# Patient Record
Sex: Male | Born: 1980 | Race: White | Hispanic: No | Marital: Married | Smoking: Former smoker
Health system: Southern US, Community
[De-identification: ages and names within clinical notes are randomized; demographics above are authoritative.]

## PROBLEM LIST (undated history)

## (undated) DIAGNOSIS — R0602 Shortness of breath: Secondary | ICD-10-CM

## (undated) HISTORY — DX: Shortness of breath: R06.02

## (undated) HISTORY — PX: BACK SURGERY: SHX140

---

## 2019-10-24 ENCOUNTER — Ambulatory Visit: Payer: 59 | Admitting: Pulmonary Disease

## 2019-10-24 ENCOUNTER — Encounter: Payer: Self-pay | Admitting: Pulmonary Disease

## 2019-10-24 ENCOUNTER — Ambulatory Visit (INDEPENDENT_AMBULATORY_CARE_PROVIDER_SITE_OTHER): Payer: 59

## 2019-10-24 ENCOUNTER — Other Ambulatory Visit: Payer: Self-pay

## 2019-10-24 VITALS — BP 118/74 | HR 88 | Temp 97.5°F | Ht 74.0 in | Wt 263.2 lb

## 2019-10-24 DIAGNOSIS — R0602 Shortness of breath: Secondary | ICD-10-CM

## 2019-10-24 NOTE — Patient Instructions (Signed)
We will get a chest x-ray today to evaluate the lungs Continue to exercise and get back into shape Follow-up in 1 to 2 months

## 2019-10-24 NOTE — Progress Notes (Signed)
Mark Mcgrath    [id number]    [birth year]  Primary Care Physician:Hungarland, Carlena Bjornstad, MD  Referring Physician: No referring provider defined for this encounter.  Chief complaint: Consult for post COVID-19 pneumonia  HPI: 39 year old with no significant past medical history.  Diagnosed with COVID-19 around December 11.  He was hospitalized for 5 days at [facility] at the end of December.  Treated with remdesivir, steroids and discharged on supplemental oxygen.  States that post discharge his breathing has improved and he has been himself off oxygen.  He still has mild dyspnea on exertion.  Started on albuterol by his primary care Chief complaint is insomnia.  He is unable to get a full nights of sleep.  Wakes up in the middle of the night and is unable to go back to sleep.  He has been advised to take Advil PM by his primary care which helps somewhat  Pets: Has a dog.  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  No mold, hot tub, Jacuzzi, no down pillows or comforters Smoking history: 5-pack-year smoker.  Quit in 2013.  He vapes nicotine from 2013 11/2016 Travel history: No significant travel history Relevant family history: No significant family history of lung disease  Outpatient Encounter Medications as of 10/24/2019  Medication Sig  . albuterol (VENTOLIN HFA) 108 (90 Base) MCG/ACT inhaler Inhale 2 puffs into the lungs every 4 (four) hours as needed.    No facility-administered encounter medications on file as of 10/24/2019.    Allergies as of 10/24/2019  . (No Known Allergies)    Past Medical History:  Diagnosis Date  . Shortness of breath     Past Surgical History:  Procedure Laterality Date  . BACK SURGERY     L4-L5    Family History  Family history unknown: Yes    Social History   Socioeconomic History  . Marital status: Married    Spouse name: Not on file  . Number of  children: Not on file  . Years of education: Not on file  . Highest education level: Not on file  Occupational History  . Not on file  Tobacco Use  . Smoking status: Former Smoker    Packs/day: 1.00    Years: 10.00    Pack years: 10.00    Types: Cigarettes    Quit date: 10/23/2009    Years since quitting: 10.0  . Smokeless tobacco: Never Used  Substance and Sexual Activity  . Alcohol use: Not Currently  . Drug use: Never  . Sexual activity: Not on file  Other Topics Concern  . Not on file  Social History Narrative  . Not on file   Social Determinants of Health   Financial Resource Strain:   . Difficulty of Paying Living Expenses: Not on file  Food Insecurity:   . Worried About Programme researcher, broadcasting/film/video in the Last Year: Not on file  . Ran Out of Food in the Last Year: Not on file  Transportation Needs:   . Lack of Transportation (Medical): Not on file  . Lack of Transportation (Non-Medical): Not on file  Physical Activity:   . Days of Exercise per Week: Not on file  . Minutes of Exercise per Session: Not on file  Stress:   . Feeling of Stress : Not on file  Social Connections:   . Frequency of Communication with Friends and Family:  Not on file  . Frequency of Social Gatherings with Friends and Family: Not on file  . Attends Religious Services: Not on file  . Active Member of Clubs or Organizations: Not on file  . Attends Archivist Meetings: Not on file  . Marital Status: Not on file  Intimate Partner Violence:   . Fear of Current or Ex-Partner: Not on file  . Emotionally Abused: Not on file  . Physically Abused: Not on file  . Sexually Abused: Not on file    Review of systems: Review of Systems  Constitutional: Negative for fever and chills.  HENT: Negative.   Eyes: Negative for blurred vision.  Respiratory: as per HPI  Cardiovascular: Negative for chest pain and palpitations.  Gastrointestinal: Negative for vomiting, diarrhea, blood per  rectum. Genitourinary: Negative for dysuria, urgency, frequency and hematuria.  Musculoskeletal: Negative for myalgias, back pain and joint pain.  Skin: Negative for itching and rash.  Neurological: Negative for dizziness, tremors, focal weakness, seizures and loss of consciousness.  Endo/Heme/Allergies: Negative for environmental allergies.  Psychiatric/Behavioral: Negative for depression, suicidal ideas and hallucinations.  All other systems reviewed and are negative.  Physical Exam: Blood pressure 118/74, pulse 88, temperature (!) 97.5 F (36.4 C), temperature source Temporal, height 6\' 2"  (1.88 m), weight 263 lb 3.2 oz (119.4 kg). Gen:      No acute distress HEENT:  EOMI, sclera anicteric Neck:     No masses; no thyromegaly Lungs:    Clear to auscultation bilaterally; normal respiratory effort CV:         Regular rate and rhythm; no murmurs Abd:      + bowel sounds; soft, non-tender; no palpable masses, no distension Ext:    No edema; adequate peripheral perfusion Skin:      Warm and dry; no rash Neuro: alert and oriented x 3 Psych: normal mood and affect  Data Reviewed:  Assessment:  Post COVID-19 pneumonia Continues to make slow improvements. Currently off supplemental oxygen with no desats noted We will get chest x-ray to evaluate his lungs.  If abnormal then get CT chest  Plan/Recommendations: -Chest x-ray  This appointment required 30 minutes of patient care (this includes precharting, chart review, review of results, face-to-face care, etc.).  Marshell Garfinkel MD [facility] 10/24/2019, 12:37 PM  CC: No ref. provider found

## 2019-11-03 ENCOUNTER — Telehealth: Payer: Self-pay | Admitting: Pulmonary Disease

## 2019-11-03 NOTE — Telephone Encounter (Signed)
Mark Greathouse, MD  11/01/2019 12:02 PM EST    Let patient know the chest x-ray looks okay with no evidence of post Covid lung issues There is mild airway inflammation which may indicate asthma Order Symbicort 160 twice daily. Will reassess at return visit.   Attempted to call pt but unable to reach and unable to leave a VM due to mailbox being full. Will try to call back later.

## 2019-11-04 MED ORDER — BUDESONIDE-FORMOTEROL FUMARATE 160-4.5 MCG/ACT IN AERO: 2.0000 | INHALATION_SPRAY | Freq: Two times a day (BID) | RESPIRATORY_TRACT | 0 refills | Status: DC

## 2019-11-04 NOTE — Telephone Encounter (Signed)
I called and spoke with the pt and notified of results/recs and he verbalized understanding  Rx was sent to pharm

## 2019-11-20 ENCOUNTER — Telehealth: Payer: Self-pay | Admitting: Pulmonary Disease

## 2019-11-20 MED ORDER — BUDESONIDE-FORMOTEROL FUMARATE 160-4.5 MCG/ACT IN AERO: 2.0000 | INHALATION_SPRAY | Freq: Two times a day (BID) | RESPIRATORY_TRACT | 11 refills | Status: AC

## 2019-11-20 NOTE — Telephone Encounter (Signed)
Called and spoke with pt. Pt stated he wanted to change pharmacies for his symbicort inhaler as he stated each time he calls his previous pharmacy to see if they have his symbicort inhaler rx there, they keep telling him that they do not have it. I have sent pt's symbicort rx to different pharmacy of pt's choice. Nothing further needed.

## 2019-12-02 ENCOUNTER — Encounter: Payer: Self-pay | Admitting: Pulmonary Disease

## 2019-12-02 ENCOUNTER — Ambulatory Visit: Payer: 59 | Admitting: Pulmonary Disease

## 2019-12-02 ENCOUNTER — Other Ambulatory Visit: Payer: Self-pay

## 2019-12-02 ENCOUNTER — Telehealth: Payer: Self-pay | Admitting: Pulmonary Disease

## 2019-12-02 VITALS — BP 118/72 | HR 64 | Temp 97.4°F | Ht 74.0 in | Wt 258.2 lb

## 2019-12-02 DIAGNOSIS — R0602 Shortness of breath: Secondary | ICD-10-CM

## 2019-12-02 NOTE — Patient Instructions (Signed)
Glad you are doing well with regard to your breathing. You can stop the inhalers as they do not appear to make any difference His exercise program and get back into shape as you are doing Follow-up as needed

## 2019-12-02 NOTE — Progress Notes (Signed)
Mark Mcgrath    [id number]    [birth year]  Primary Care Physician:Hungarland, Carlena Bjornstad, MD  Referring Physician: Maximiano Coss, MD [facility] [address] [place],  [place] [address]  Chief complaint: Post COVID-19 pneumonia  HPI: 39 year old with no significant past medical history.  Diagnosed with COVID-19 around December 11.  He was hospitalized for 5 days at [facility] at the end of December.  Treated with remdesivir, steroids and discharged on supplemental oxygen.  States that post discharge his breathing has improved and he has been himself off oxygen.  He still has mild dyspnea on exertion.  Started on albuterol by his primary care Chief complaint is insomnia.  He is unable to get a full nights of sleep.  Wakes up in the middle of the night and is unable to go back to sleep.  He has been advised to take Advil PM by his primary care which helps somewhat  Pets: Has a dog.  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  No mold, hot tub, Jacuzzi, no down pillows or comforters Smoking history: 5-pack-year smoker.  Quit in 2013.  He vapes nicotine from 2013 11/2016 Travel history: No significant travel history Relevant family history: No significant family history of lung disease  Interim history: Continues to do well with improving respiratory status.  He feels back to baseline now He has started exercising with a Peloton and tennis without any issues His sleep is improved as well.  We had given him Symbicort which he has used for 2 weeks but cannot tell if it is making any difference  Outpatient Encounter Medications as of 12/02/2019  Medication Sig  . albuterol (VENTOLIN HFA) 108 (90 Base) MCG/ACT inhaler Inhale 2 puffs into the lungs every 4 (four) hours as needed.   . budesonide-formoterol (SYMBICORT) 160-4.5 MCG/ACT inhaler Inhale 2 puffs into the lungs 2  (two) times daily.   No facility-administered encounter medications on file as of 12/02/2019.   Physical Exam: Blood pressure 118/74, pulse 88, temperature (!) 97.5 F (36.4 C), temperature source Temporal, height 6\' 2"  (1.88 m), weight 263 lb 3.2 oz (119.4 kg). Gen:      No acute distress HEENT:  EOMI, sclera anicteric Neck:     No masses; no thyromegaly Lungs:    Clear to auscultation bilaterally; normal respiratory effort CV:         Regular rate and rhythm; no murmurs Abd:      + bowel sounds; soft, non-tender; no palpable masses, no distension Ext:    No edema; adequate peripheral perfusion Skin:      Warm and dry; no rash Neuro: alert and oriented x 3 Psych: normal mood and affect  Data Reviewed: Chest x-ray 10/24/2019-active cardiopulmonary disease, mild central airway thickening.  I have reviewed the images personally.  Assessment:  Post COVID-19 pneumonia Continues to improve and is back to baseline Chest x-ray with no significant lung abnormality.  There is suggestion of reactive airways disease but clinically does not appear to have asthma Symbicort has not made any difference and we will stop it  I told him to continue an exercise regimen and get back into shape.  As he is doing well he can follow-up with 10/26/2019 as needed  Plan/Recommendations: - Stop inhalers.  Follow-up as needed  This appointment required 30 minutes of patient care (this includes precharting, chart  review, review of results, face-to-face care, etc.).  Marshell Garfinkel MD [facility] 12/02/2019, 8:28 AM  CC: Hungarland, Jenetta Downer,*

## 2019-12-02 NOTE — Telephone Encounter (Signed)
Order placed to have pt's O2 d/c'd. Nothing further needed.

## 2021-05-04 IMAGING — DX DG CHEST 2V
2 series · 2 of 2 positions shown · non-contrast
Comparison: None.

CLINICAL DATA: Shortness of breath, post COVID

EXAM:
CHEST - 2 VIEW

[chest pa]
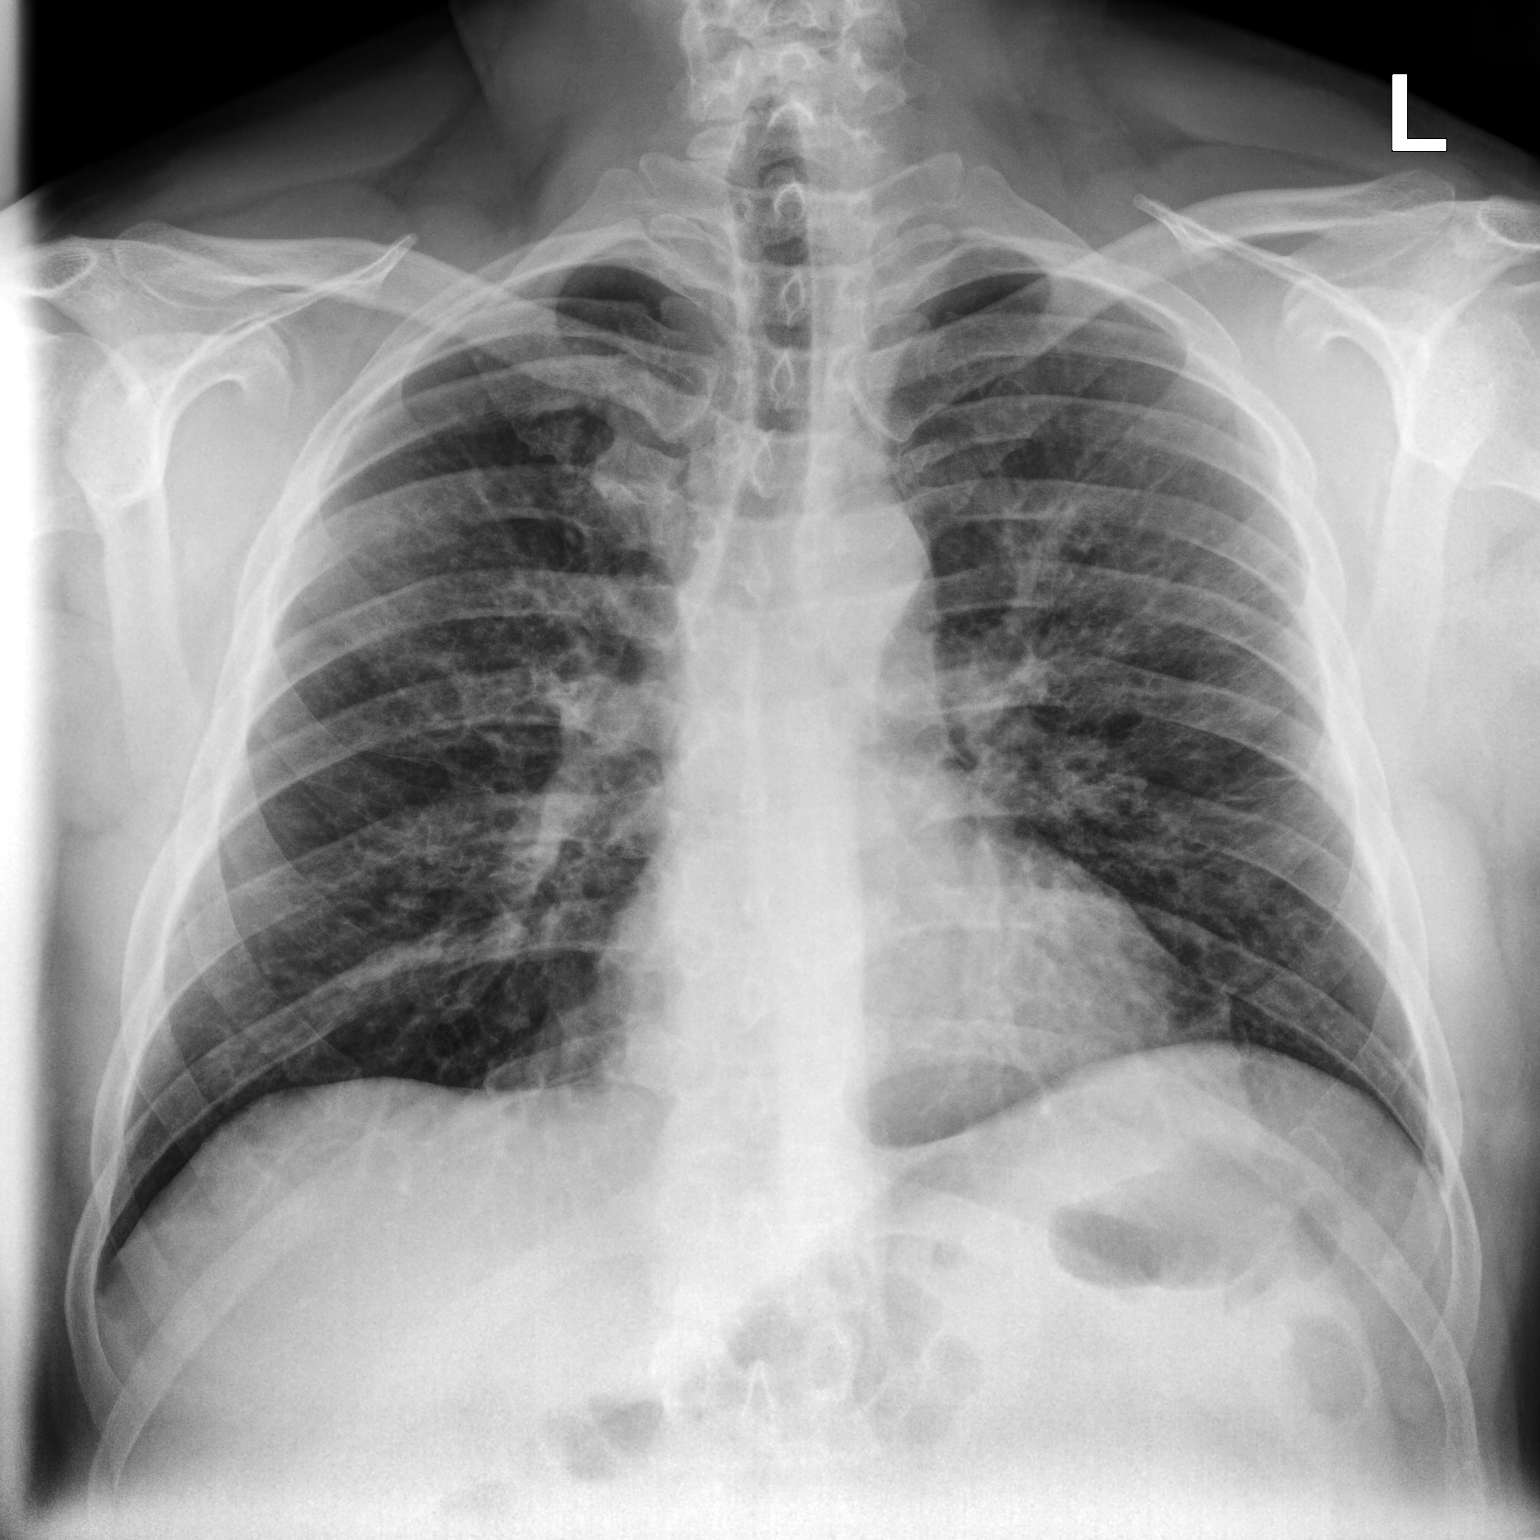

[chest lat]
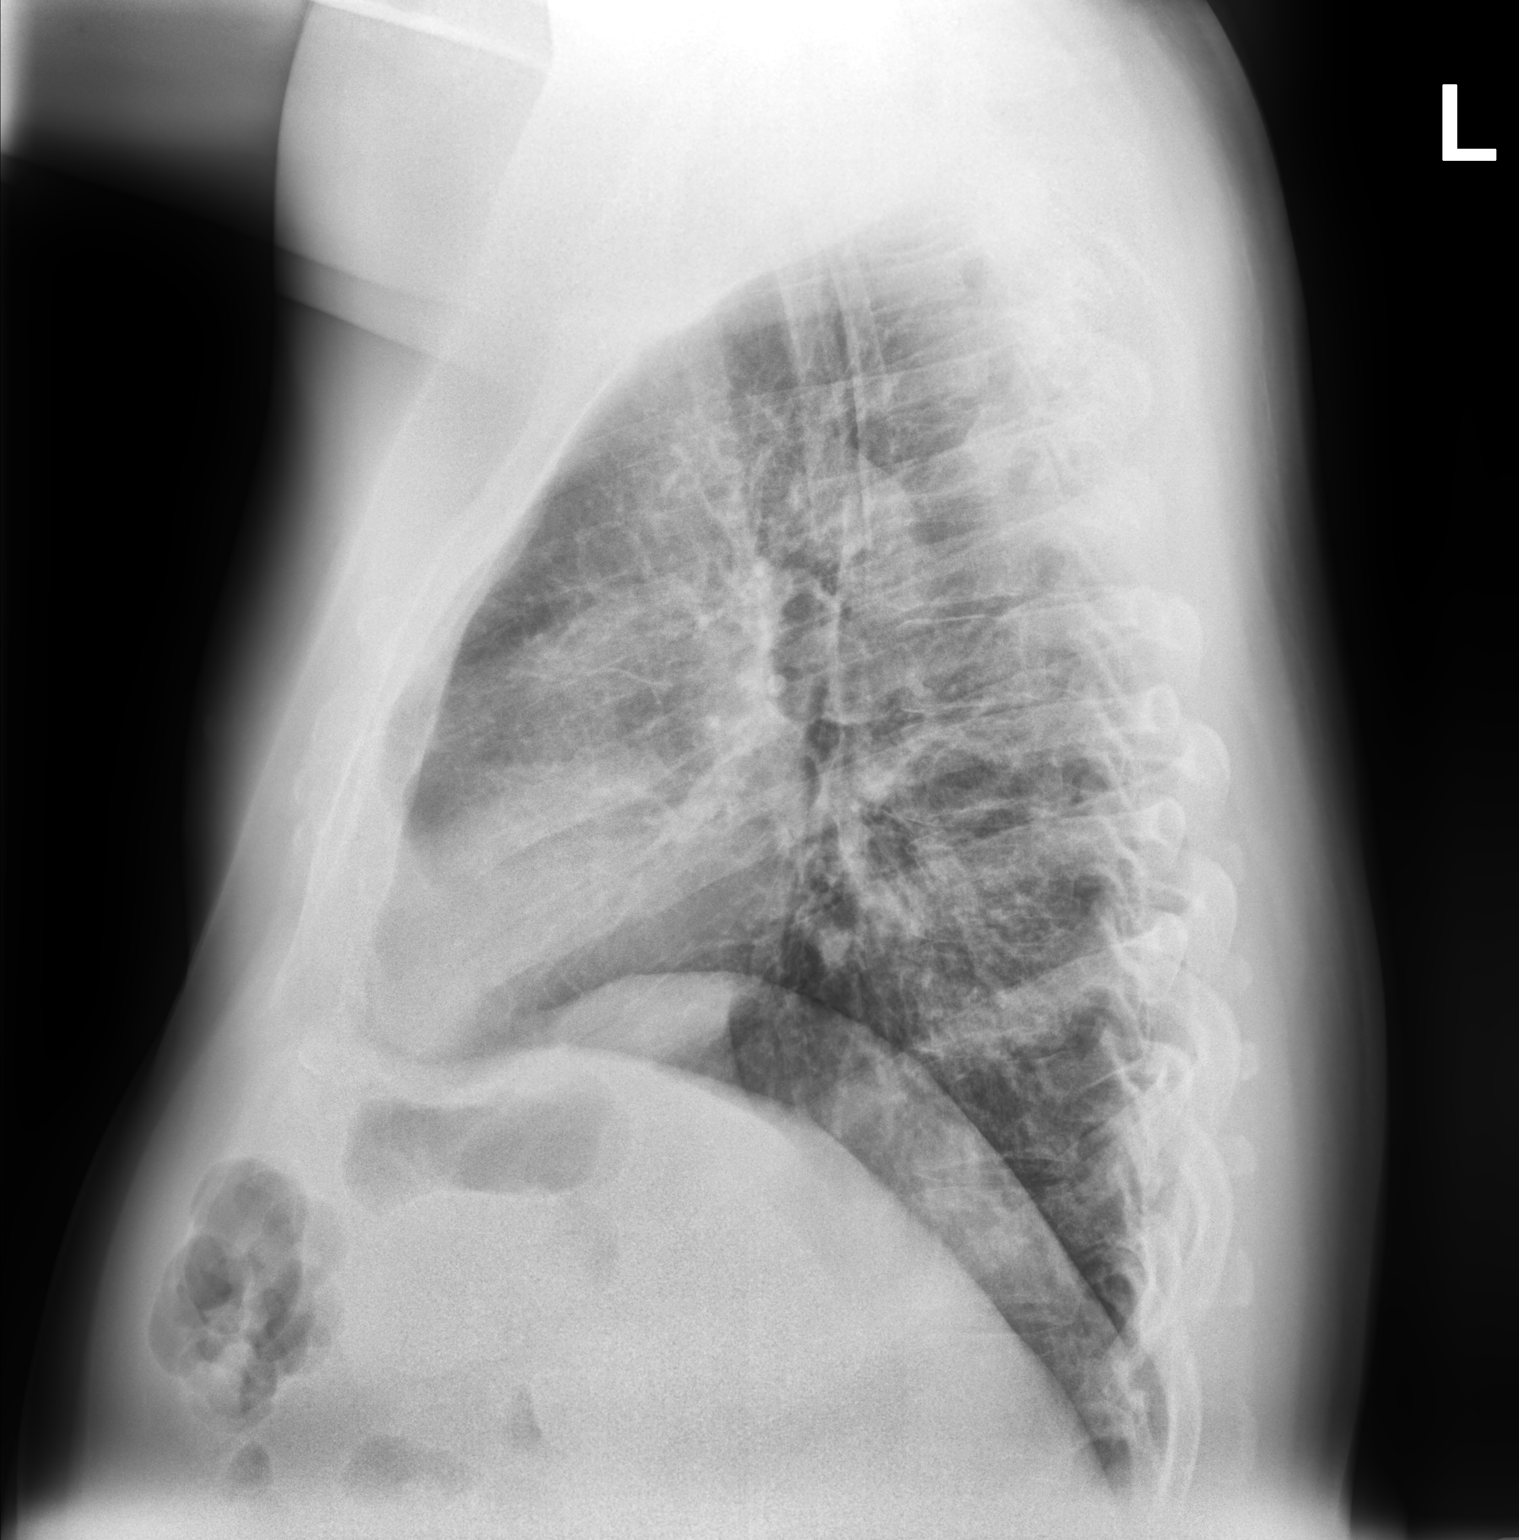

[2 of 2 positions shown; findings below may reference images not displayed]

FINDINGS: No focal airspace disease. Slightly coarse perihilar interstitial
opacities with mild cuffing. Normal heart size. No pneumothorax.
IMPRESSION: No active cardiopulmonary disease. Mild central airways thickening,
possible reactive airways.
